# Patient Record
Sex: Female | Born: 1975 | Race: White | Hispanic: No | Marital: Married | Smoking: Never smoker
Health system: Southern US, Community
[De-identification: ages and names within clinical notes are randomized; demographics above are authoritative.]

## PROBLEM LIST (undated history)

## (undated) DIAGNOSIS — C801 Malignant (primary) neoplasm, unspecified: Secondary | ICD-10-CM

## (undated) HISTORY — PX: CHOLECYSTECTOMY: SHX55

## (undated) HISTORY — PX: OOPHORECTOMY: SHX86

## (undated) HISTORY — PX: CYSTECTOMY: SUR359

## (undated) HISTORY — PX: BARIATRIC SURGERY: SHX1103

---

## 2014-05-03 ENCOUNTER — Emergency Department (HOSPITAL_BASED_OUTPATIENT_CLINIC_OR_DEPARTMENT_OTHER): Payer: Medicaid Other

## 2014-05-03 ENCOUNTER — Encounter (HOSPITAL_BASED_OUTPATIENT_CLINIC_OR_DEPARTMENT_OTHER): Payer: Self-pay | Admitting: Emergency Medicine

## 2014-05-03 ENCOUNTER — Emergency Department (HOSPITAL_BASED_OUTPATIENT_CLINIC_OR_DEPARTMENT_OTHER)
Admission: EM | Admit: 2014-05-03 | Discharge: 2014-05-04 | Disposition: A | Discharge status: Home / Self Care | Payer: Medicaid Other | Attending: Emergency Medicine | Admitting: Emergency Medicine

## 2014-05-03 DIAGNOSIS — R112 Nausea with vomiting, unspecified: Secondary | ICD-10-CM | POA: Diagnosis not present

## 2014-05-03 DIAGNOSIS — Z859 Personal history of malignant neoplasm, unspecified: Secondary | ICD-10-CM | POA: Insufficient documentation

## 2014-05-03 DIAGNOSIS — Z79899 Other long term (current) drug therapy: Secondary | ICD-10-CM | POA: Diagnosis not present

## 2014-05-03 DIAGNOSIS — Z9089 Acquired absence of other organs: Secondary | ICD-10-CM | POA: Insufficient documentation

## 2014-05-03 DIAGNOSIS — R1013 Epigastric pain: Secondary | ICD-10-CM | POA: Diagnosis present

## 2014-05-03 DIAGNOSIS — K644 Residual hemorrhoidal skin tags: Secondary | ICD-10-CM | POA: Insufficient documentation

## 2014-05-03 DIAGNOSIS — Z3202 Encounter for pregnancy test, result negative: Secondary | ICD-10-CM | POA: Insufficient documentation

## 2014-05-03 DIAGNOSIS — Z9889 Other specified postprocedural states: Secondary | ICD-10-CM | POA: Diagnosis not present

## 2014-05-03 HISTORY — DX: Malignant (primary) neoplasm, unspecified: C80.1

## 2014-05-03 LAB — URINALYSIS, ROUTINE W REFLEX MICROSCOPIC
BILIRUBIN URINE: NEGATIVE
GLUCOSE, UA: NEGATIVE mg/dL
Ketones, ur: NEGATIVE mg/dL
Leukocytes, UA: NEGATIVE
Nitrite: NEGATIVE
PH: 6 (ref 5.0–8.0)
Protein, ur: NEGATIVE mg/dL
SPECIFIC GRAVITY, URINE: 1.018 (ref 1.005–1.030)
Urobilinogen, UA: 1 mg/dL (ref 0.0–1.0)

## 2014-05-03 LAB — COMPREHENSIVE METABOLIC PANEL
ALBUMIN: 3.6 g/dL (ref 3.5–5.2)
ALT: 6 U/L (ref 0–35)
AST: 23 U/L (ref 0–37)
Alkaline Phosphatase: 77 U/L (ref 39–117)
Anion gap: 14 (ref 5–15)
BUN: 8 mg/dL (ref 6–23)
CALCIUM: 8.6 mg/dL (ref 8.4–10.5)
CO2: 26 meq/L (ref 19–32)
Chloride: 103 mEq/L (ref 96–112)
Creatinine, Ser: 0.6 mg/dL (ref 0.50–1.10)
GFR calc Af Amer: >90 mL/min (ref >90)
Glucose, Bld: 101 mg/dL — ABNORMAL HIGH (ref 70–99)
Potassium: 3.7 mEq/L (ref 3.7–5.3)
SODIUM: 143 meq/L (ref 137–147)
Total Bilirubin: 0.2 mg/dL — ABNORMAL LOW (ref 0.3–1.2)
Total Protein: 7 g/dL (ref 6.0–8.3)

## 2014-05-03 LAB — LIPASE, BLOOD: LIPASE: 66 U/L — AB (ref 11–59)

## 2014-05-03 LAB — PROTIME-INR
INR: 0.97 (ref 0.00–1.49)
Prothrombin Time: 12.9 seconds (ref 11.6–15.2)

## 2014-05-03 LAB — CBC WITH DIFFERENTIAL/PLATELET
BASOS ABS: 0.1 10*3/uL (ref 0.0–0.1)
BASOS PCT: 1 % (ref 0–1)
Eosinophils Absolute: 0.3 10*3/uL (ref 0.0–0.7)
Eosinophils Relative: 5 % (ref 0–5)
HCT: 36 % (ref 36.0–46.0)
HEMOGLOBIN: 11.1 g/dL — AB (ref 12.0–15.0)
LYMPHS PCT: 39 % (ref 12–46)
Lymphs Abs: 2.3 10*3/uL (ref 0.7–4.0)
MCH: 25.4 pg — ABNORMAL LOW (ref 26.0–34.0)
MCHC: 30.8 g/dL (ref 30.0–36.0)
MCV: 82.4 fL (ref 78.0–100.0)
MONOS PCT: 9 % (ref 3–12)
Monocytes Absolute: 0.5 10*3/uL (ref 0.1–1.0)
NEUTROS PCT: 46 % (ref 43–77)
Neutro Abs: 2.8 10*3/uL (ref 1.7–7.7)
Platelets: 289 10*3/uL (ref 150–400)
RBC: 4.37 MIL/uL (ref 3.87–5.11)
RDW: 14.1 % (ref 11.5–15.5)
WBC: 6 10*3/uL (ref 4.0–10.5)

## 2014-05-03 LAB — URINE MICROSCOPIC-ADD ON

## 2014-05-03 LAB — PREGNANCY, URINE: Preg Test, Ur: NEGATIVE

## 2014-05-03 LAB — OCCULT BLOOD X 1 CARD TO LAB, STOOL: FECAL OCCULT BLD: POSITIVE — AB

## 2014-05-03 MED ORDER — ONDANSETRON HCL 4 MG PO TABS: 4.0000 mg | ORAL_TABLET | Freq: Four times a day (QID) | ORAL | Status: AC

## 2014-05-03 MED ORDER — IOHEXOL 300 MG/ML  SOLN
100.0000 mL | Freq: Once | INTRAMUSCULAR | Status: AC | PRN
  Administered 2014-05-03: 100 mL via INTRAVENOUS

## 2014-05-03 MED ORDER — OMEPRAZOLE 20 MG PO CPDR: 20.0000 mg | DELAYED_RELEASE_CAPSULE | Freq: Every day | ORAL | Status: AC

## 2014-05-03 MED ORDER — SODIUM CHLORIDE 0.9 % IV SOLN
1000.0000 mL | Freq: Once | INTRAVENOUS | Status: AC
  Administered 2014-05-03: 1000 mL via INTRAVENOUS

## 2014-05-03 MED ORDER — TRAMADOL HCL 50 MG PO TABS: 50.0000 mg | ORAL_TABLET | Freq: Four times a day (QID) | ORAL | Status: AC | PRN

## 2014-05-03 MED ORDER — SODIUM CHLORIDE 0.9 % IV SOLN
1000.0000 mL | INTRAVENOUS | Status: DC
  Administered 2014-05-03: 1000 mL via INTRAVENOUS

## 2014-05-03 MED ORDER — IOHEXOL 300 MG/ML  SOLN
50.0000 mL | Freq: Once | INTRAMUSCULAR | Status: AC | PRN
  Administered 2014-05-03: 50 mL via ORAL

## 2014-05-03 MED ORDER — ONDANSETRON HCL 4 MG/2ML IJ SOLN
4.0000 mg | Freq: Once | INTRAMUSCULAR | Status: AC
  Administered 2014-05-03: 4 mg via INTRAVENOUS
  Filled 2014-05-03: qty 2

## 2014-05-03 MED ORDER — HYDROMORPHONE HCL 1 MG/ML IJ SOLN
1.0000 mg | INTRAMUSCULAR | Status: DC | PRN
  Administered 2014-05-03: 1 mg via INTRAVENOUS
  Filled 2014-05-03: qty 1

## 2014-05-03 NOTE — ED Notes (Signed)
Epigastric pain w vomiting,  Bright red blood in stool   Worse after eating

## 2014-05-03 NOTE — ED Provider Notes (Signed)
CSN: [id number]     Arrival date & time 05/03/14  2048 History   First MD Initiated Contact with Patient 05/03/14 2054     This chart was scribed for Jamie Rank, MD by Forrestine Him, ED Scribe. This patient was seen in room MH10/MH10 and the patient's care was started 11:40 PM.   Chief Complaint  Patient presents with  . Abdominal Pain   The history is provided by the patient. No language interpreter was used.    HPI Comments: Jamie Williamson is a 38 y.o. female who presents to the Emergency Department complaining of constant, moderate, epigastric abdominal pain x 2 weeks that is unchagned. Currently rates pain 12/10. Pt also reports ongoing, constant vomiting. Ms. [name] is unable to keep any solid foods down at this time. Symptoms are worsened with eating. No alleviating factors. He admits to bright red blood in her stool, last noted at approximately 3 PM this afternoon. 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 Pt periodically uses Urecholine medication to help with bowel movements. PSHx includes gastric bypass 6 years ago performed by Dr. Arsenio Katz and an appendectomy. She is an occasional drinker. No illicit drug use. No known allergies to medications.  Past Medical History  Diagnosis Date  . Cancer    Past Surgical History  Procedure Laterality Date  . Bariatric surgery    . Cholecystectomy    . Cesarean section    . Cystectomy    . Oophorectomy     No family history on file. History  Substance Use Topics  . Smoking status: Never Smoker   . Smokeless tobacco: Not on file  . Alcohol Use: Yes   OB History   Grav Para Term Preterm Abortions TAB SAB Ect Mult Living                 Review of Systems  Gastrointestinal: Negative for diarrhea.  Genitourinary: Negative for dysuria.    A complete 10 system review of systems was obtained and all systems are negative except as  noted in the HPI and PMH.     Allergies  Vicodin  Home Medications   Prior to Admission medications   Medication Sig Start Date End Date Taking? Authorizing Provider  bethanechol (URECHOLINE) 50 MG tablet Take 100 mg by mouth 3 (three) times daily.   Yes Historical Provider, MD  omeprazole (PRILOSEC) 20 MG capsule Take 1 capsule (20 mg total) by mouth daily. 05/03/14   Jamie Rank, MD  ondansetron (ZOFRAN) 4 MG tablet Take 1 tablet (4 mg total) by mouth every 6 (six) hours. 05/03/14   Jamie Rank, MD  traMADol (ULTRAM) 50 MG tablet Take 1 tablet (50 mg total) by mouth every 6 (six) hours as needed. 05/03/14   Jamie Rank, MD   Triage Vitals: BP 147/92  Pulse 86  Temp(Src) 98.2 F (36.8 C) (Oral)  Resp 22  Ht 5\' 7"  (1.702 m)  Wt 188 lb (85.276 kg)  BMI 29.44 kg/m2  SpO2 100%   Physical Exam  Nursing note and vitals reviewed. Constitutional: She appears well-developed and well-nourished. No distress.  HENT:  Head: Normocephalic and atraumatic.  Right Ear: External ear normal.  Left Ear: External ear normal.  Eyes: Conjunctivae are normal. Right eye exhibits no discharge. Left eye exhibits no discharge. No scleral icterus.  Neck: Neck supple. No tracheal deviation present.  Cardiovascular: Normal rate, regular rhythm and intact distal  pulses.   Pulmonary/Chest: Effort normal and breath sounds normal. No stridor. No respiratory distress. She has no wheezes. She has no rales.  Abdominal: Soft. Bowel sounds are normal. She exhibits no distension, no ascites, no pulsatile midline mass and no mass. There is tenderness in the epigastric area. There is no rigidity, no rebound and no guarding. No hernia.  Genitourinary: Rectal exam shows external hemorrhoid. Rectal exam shows no mass and no tenderness.  Blood tinge mucous noted on exam  Musculoskeletal: She exhibits no edema and no tenderness.  Neurological: She is alert. She has normal strength. No cranial nerve deficit (no facial droop,  extraocular movements intact, no slurred speech) or sensory deficit. She exhibits normal muscle tone. She displays no seizure activity. Coordination normal.  Skin: Skin is warm and dry. No rash noted.  Psychiatric: She has a normal mood and affect.    ED Course  Procedures (including critical care time)  DIAGNOSTIC STUDIES: Oxygen Saturation is 100% on RA, Normal by my interpretation.    COORDINATION OF CARE: 11:40 PM- Will order pregnancy urine and urinalysis. Discussed treatment plan with pt at bedside and pt agreed to plan.     Labs Review Labs Reviewed  URINALYSIS, ROUTINE W REFLEX MICROSCOPIC - Abnormal; Notable for the following:    Hgb urine dipstick LARGE (*)    All other components within normal limits  OCCULT BLOOD X 1 CARD TO LAB, STOOL - Abnormal; Notable for the following:    Fecal Occult Bld POSITIVE (*)    All other components within normal limits  CBC WITH DIFFERENTIAL - Abnormal; Notable for the following:    Hemoglobin 11.1 (*)    MCH 25.4 (*)    All other components within normal limits  COMPREHENSIVE METABOLIC PANEL - Abnormal; Notable for the following:    Glucose, Bld 101 (*)    Total Bilirubin 0.2 (*)    All other components within normal limits  LIPASE, BLOOD - Abnormal; Notable for the following:    Lipase 66 (*)    All other components within normal limits  URINE MICROSCOPIC-ADD ON - Abnormal; Notable for the following:    Squamous Epithelial / LPF FEW (*)    All other components within normal limits  PREGNANCY, URINE  PROTIME-INR    Imaging Review Ct Abdomen Pelvis W Contrast  05/03/2014   CLINICAL DATA:  Abdominal pain  EXAM: CT ABDOMEN AND PELVIS WITH CONTRAST  TECHNIQUE: Multidetector CT imaging of the abdomen and pelvis was performed using the standard protocol following bolus administration of intravenous contrast.  CONTRAST:  132mL OMNIPAQUE IOHEXOL 300 MG/ML SOLN, 62mL OMNIPAQUE IOHEXOL 300 MG/ML SOLN  COMPARISON:  Prior radiograph from  05/03/2014  FINDINGS: Mild subsegmental atelectasis seen dependently within the visualized lung bases. No pleural or pericardial effusion.  The liver demonstrates a normal contrast enhanced appearance. Gallbladder is surgically absent. Mild intra and extrahepatic biliary dilatation likely reflects post cholecystectomy changes. The spleen, adrenal glands, and pancreas demonstrate a normal contrast enhanced appearance.  Kidneys are equal in size with symmetric enhancement. No nephrolithiasis, hydronephrosis, or focal enhancing renal mass.  Sequelae of prior gastric bypass seen. No evidence of bowel obstruction. No abnormal wall thickening, mucosal enhancement, or inflammatory fat stranding seen about the bowels. No evidence for acute appendicitis.  Small fat containing paraumbilical hernia noted.  Bladder within normal limits. Uterus is unremarkable. Tiny cystic lesion within the left ovary likely represents a normal physiologic cyst. Right ovary unremarkable.  Trace free fluid noted within the  pelvis, likely physiologic. No free intraperitoneal air. No adenopathy. Normal intravascular enhancement seen throughout the abdomen and pelvis.  No acute osseous abnormality. No worrisome lytic or blastic osseous lesions. Degenerative disc desiccation present at L5-S1.  IMPRESSION: 1. No CT evidence of acute intra-abdominal or pelvic process. 2. Status post gastric bypass and cholecystectomy. 3. No CT evidence for acute pancreatitis.   Electronically Signed   By: Jeannine Boga M.D.   On: 05/03/2014 23:22   Dg Abd Acute W/chest  05/03/2014   CLINICAL DATA:  38 year old female with abdominal pain with nausea vomiting. History of gastric bypass.  EXAM: ACUTE ABDOMEN SERIES (ABDOMEN 2 VIEW & CHEST 1 VIEW)  COMPARISON:  08/23/2012 and prior CTs.  FINDINGS: The cardiomediastinal silhouette is unremarkable.  There is no evidence of airspace disease, pleural effusion or pneumothorax.  A few nondistended gas-filled loops of  small bowel are present.  A small to moderate amount of stool within the colon and rectum noted.  Cholecystectomy clips are present.  There is no evidence of bowel obstruction or pneumoperitoneum.  No suspicious calcifications are identified.  IMPRESSION: Nonspecific nonobstructive bowel gas pattern -no evidence of pneumoperitoneum.  No evidence of acute cardiopulmonary disease.   Electronically Signed   By: Hassan Rowan M.D.   On: 05/03/2014 23:10   Medications  0.9 %  sodium chloride infusion (0 mLs Intravenous Stopped 05/03/14 2221)    Followed by  0.9 %  sodium chloride infusion (1,000 mLs Intravenous New Bag/Given 05/03/14 2330)  HYDROmorphone (DILAUDID) injection 1 mg (1 mg Intravenous Given 05/03/14 2125)  ondansetron (ZOFRAN) injection 4 mg (4 mg Intravenous Given 05/03/14 2125)  iohexol (OMNIPAQUE) 300 MG/ML solution 50 mL (50 mLs Oral Contrast Given 05/03/14 2253)  iohexol (OMNIPAQUE) 300 MG/ML solution 100 mL (100 mLs Intravenous Contrast Given 05/03/14 2253)     MDM   Final diagnoses:  Epigastric pain  Non-intractable vomiting with nausea, vomiting of unspecified type    CT scan does not show any acute abnormalities.  Patient has been having symptoms for several weeks. She did have some blood on rectal exam that there was an external hemorrhoid noted.  There was some blood in her urine as well however no kidney stone was noted on CT scan.  SHe does not appear to be any acute emergency abnormality noted on  the CT scan. I encouraged followup with her primary doctor and gastric surgeon.  She will need followup on the blood in her stool and blood in her urine   I personally performed the services described in this documentation, which was scribed in my presence. The recorded information has been reviewed and is accurate.    Jamie Rank, MD 05/03/14 [phone]

## 2014-05-03 NOTE — Discharge Instructions (Signed)

## 2014-05-03 NOTE — ED Notes (Addendum)
Pt had bariatric surgery 6 years ago and is having upper central abd pain and bright red blood in her stool.

## 2014-05-03 NOTE — ED Notes (Signed)
Patient transported to CT 

## 2015-04-01 IMAGING — CT CT ABD-PELV W/ CM
2 of 4 series · 16 of 46 positions shown, 18 images · IV contrast (APPLIED)
Comparison: Prior radiograph from 05/03/2014

CLINICAL DATA: Abdominal pain

EXAM:
CT ABDOMEN AND PELVIS WITH CONTRAST
TECHNIQUE: Multidetector CT imaging of the abdomen and pelvis was performed
using the standard protocol following bolus administration of
intravenous contrast.
CONTRAST:  100mL OMNIPAQUE IOHEXOL 300 MG/ML SOLN, 50mL OMNIPAQUE
IOHEXOL 300 MG/ML SOLN

[Series 2: abd/pelvis 5.0 b31f · axial · 0.91mm/px · z∈[-506,-61]mm · 13 of 99 slices shown, 15 images]
[im 5/99  soft-tissue]
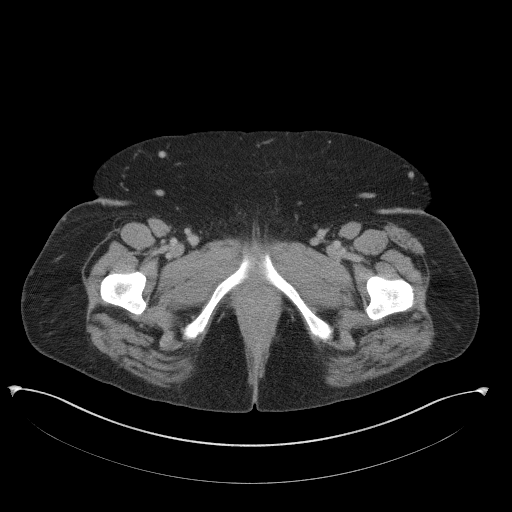
[im 5/99  bone]
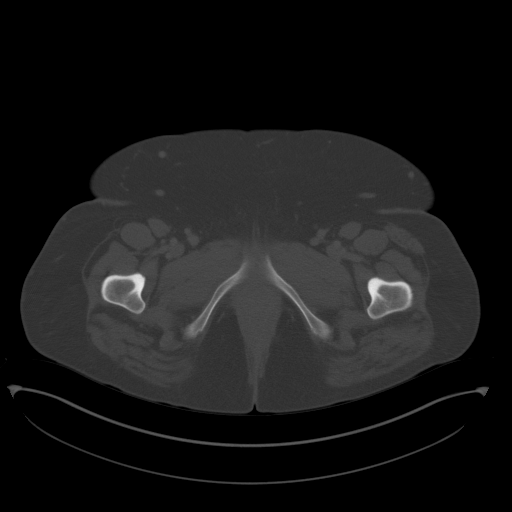
[im 13/99  soft-tissue]
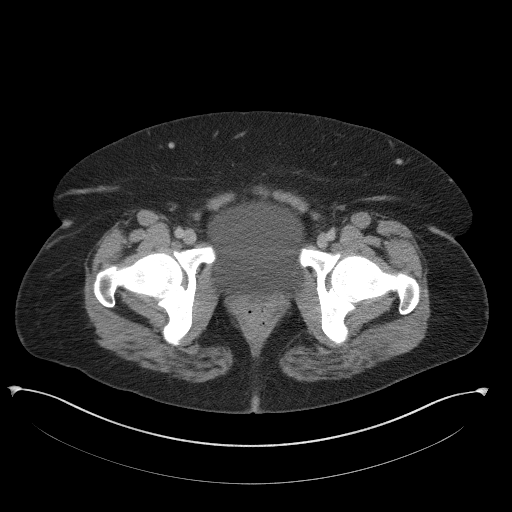
[im 22/99  soft-tissue]
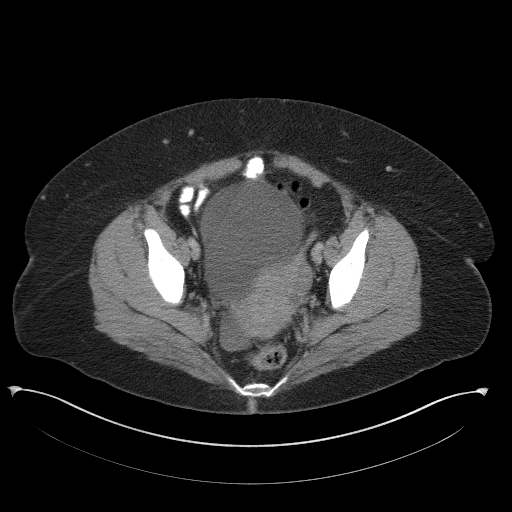
[im 26/99  soft-tissue]
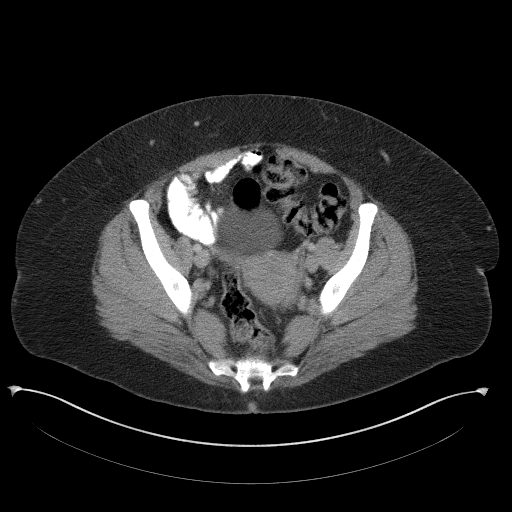
[im 35/99  soft-tissue]
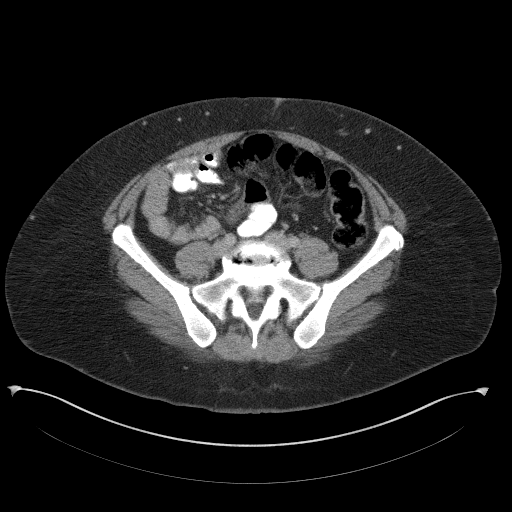
[im 43/99  soft-tissue]
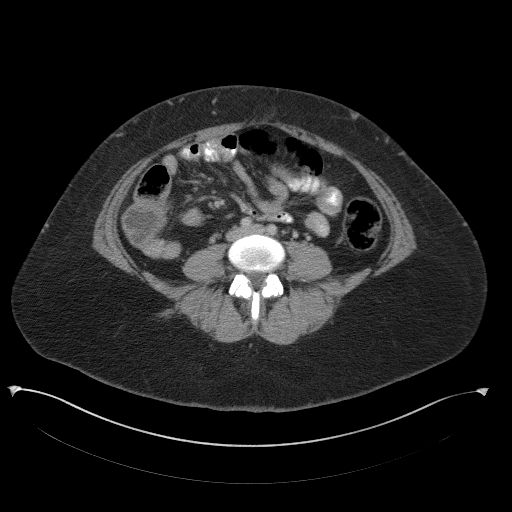
[im 52/99  soft-tissue]
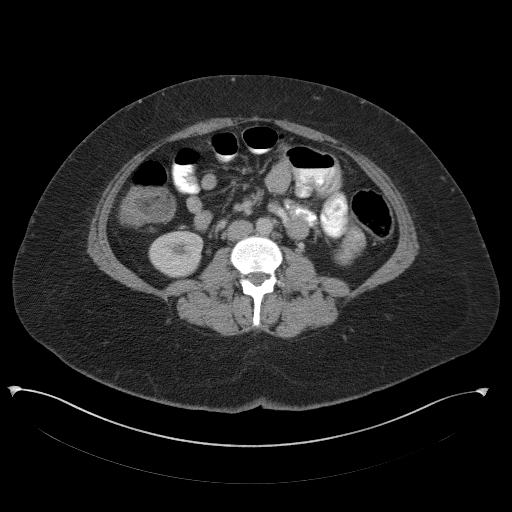
[im 56/99  soft-tissue]
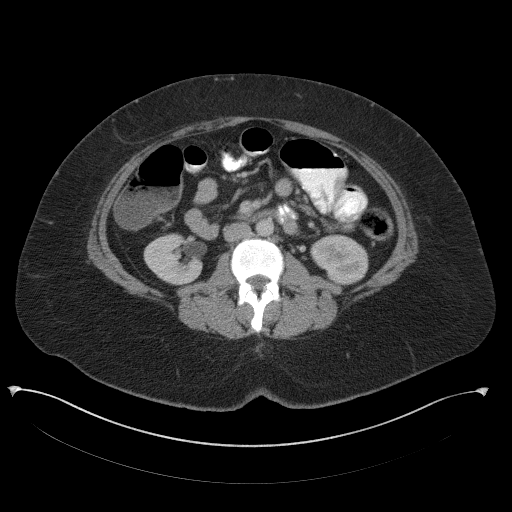
[im 64/99  soft-tissue]
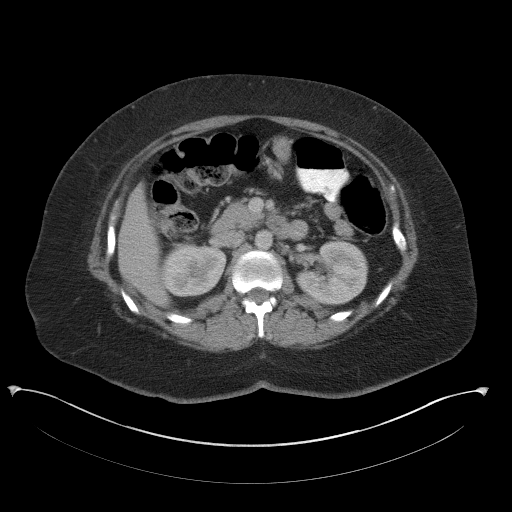
[im 64/99  bone]
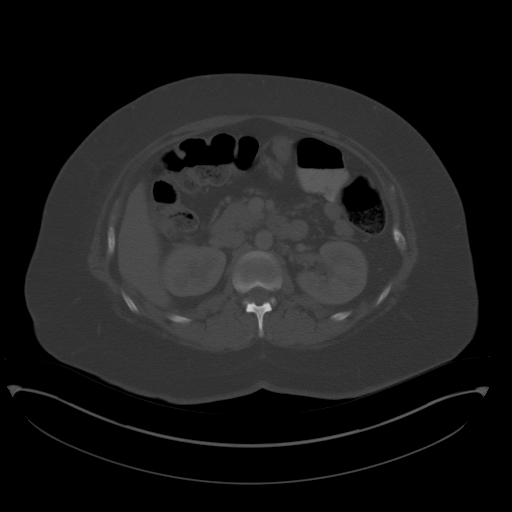
[im 73/99  soft-tissue]
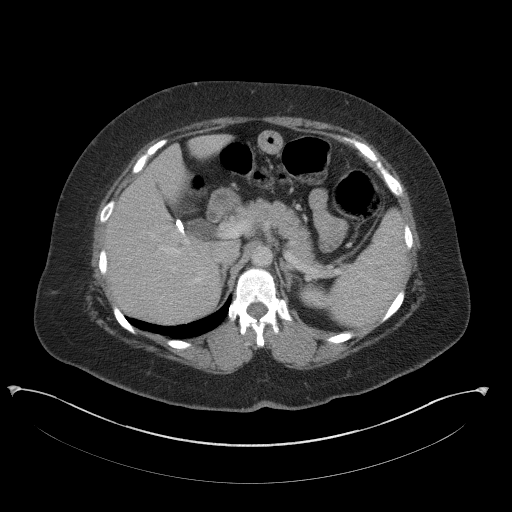
[im 77/99  soft-tissue]
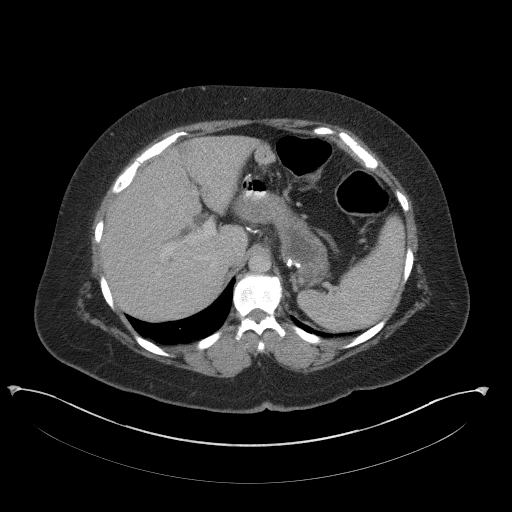
[im 86/99  soft-tissue]
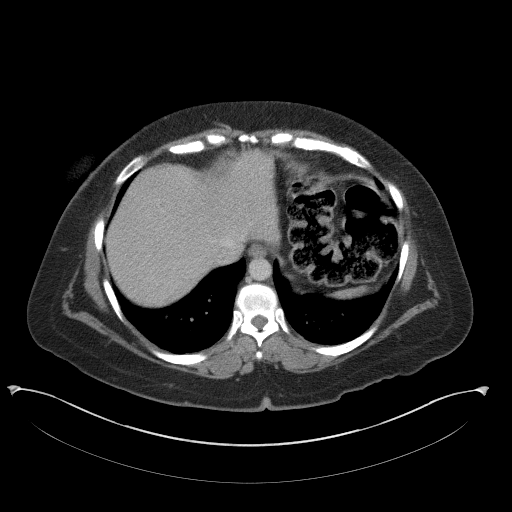
[im 94/99  soft-tissue]
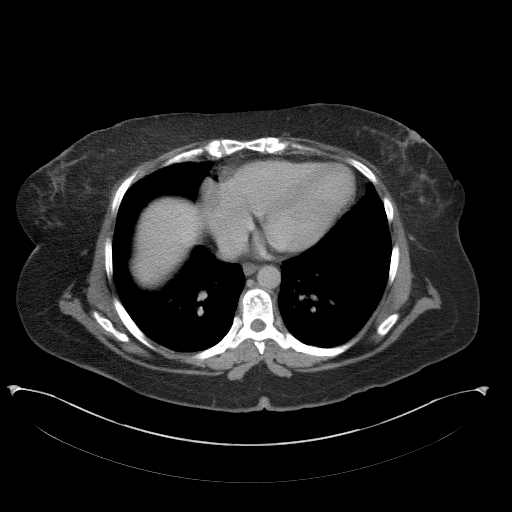

[Series 5: abd/pelvis 3.0 coronal · coronal · 0.84mm/px · 3 of 98 slices shown]
[im 33/98  soft-tissue]
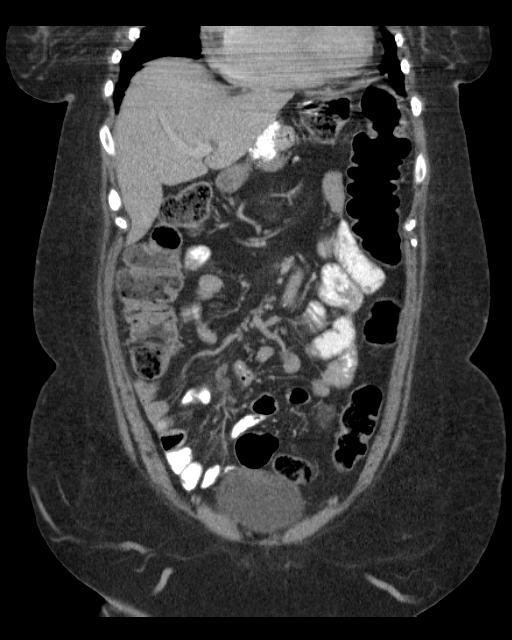
[im 44/98  soft-tissue]
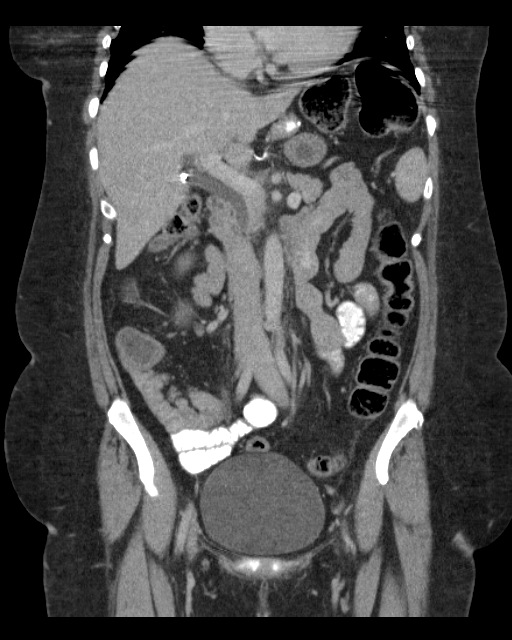
[im 54/98  soft-tissue]
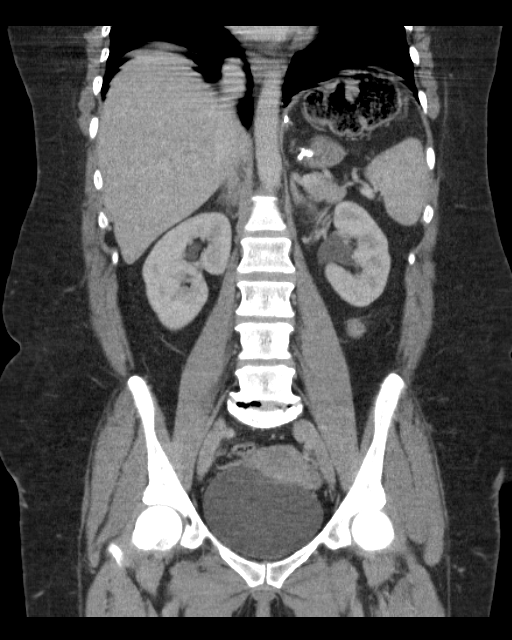

[16 of 46 positions shown; findings below may reference images not displayed]

FINDINGS: Mild subsegmental atelectasis seen dependently within the visualized
lung bases. No pleural or pericardial effusion.

The liver demonstrates a normal contrast enhanced appearance.
Gallbladder is surgically absent. Mild intra and extrahepatic
biliary dilatation likely reflects post cholecystectomy changes. The
spleen, adrenal glands, and pancreas demonstrate a normal contrast
enhanced appearance.

Kidneys are equal in size with symmetric enhancement. No
nephrolithiasis, hydronephrosis, or focal enhancing renal mass.

Sequelae of prior gastric bypass seen. No evidence of bowel
obstruction. No abnormal wall thickening, mucosal enhancement, or
inflammatory fat stranding seen about the bowels. No evidence for
acute appendicitis.

Small fat containing paraumbilical hernia noted.

Bladder within normal limits. Uterus is unremarkable. Tiny cystic
lesion within the left ovary likely represents a normal physiologic
cyst. Right ovary unremarkable.

Trace free fluid noted within the pelvis, likely physiologic. No
free intraperitoneal air. No adenopathy. Normal intravascular
enhancement seen throughout the abdomen and pelvis.

No acute osseous abnormality. No worrisome lytic or blastic osseous
lesions. Degenerative disc desiccation present at L5-S1.
IMPRESSION: 1. No CT evidence of acute intra-abdominal or pelvic process.
2. Status post gastric bypass and cholecystectomy.
3. No CT evidence for acute pancreatitis.
